# Patient Record
Sex: Male | Born: 1980 | Race: Black or African American | Hispanic: No | Marital: Single | Smoking: Current every day smoker
Health system: Southern US, Community
[De-identification: ages and names within clinical notes are randomized; demographics above are authoritative.]

---

## 2002-03-30 ENCOUNTER — Emergency Department (HOSPITAL_COMMUNITY): Admission: EM | Admit: 2002-03-30 | Discharge: 2002-03-30 | Payer: Self-pay | Admitting: Emergency Medicine

## 2005-09-19 ENCOUNTER — Emergency Department (HOSPITAL_COMMUNITY): Admission: EM | Admit: 2005-09-19 | Discharge: 2005-09-19 | Payer: Self-pay | Admitting: Emergency Medicine

## 2008-04-02 ENCOUNTER — Emergency Department (HOSPITAL_COMMUNITY): Admission: EM | Admit: 2008-04-02 | Discharge: 2008-04-02 | Payer: Self-pay | Admitting: Emergency Medicine

## 2008-04-16 ENCOUNTER — Ambulatory Visit (HOSPITAL_BASED_OUTPATIENT_CLINIC_OR_DEPARTMENT_OTHER): Admission: RE | Admit: 2008-04-16 | Discharge: 2008-04-16 | Payer: Self-pay | Admitting: Plastic Surgery

## 2009-01-03 HISTORY — PX: OTHER SURGICAL HISTORY: SHX169

## 2010-05-17 ENCOUNTER — Emergency Department (HOSPITAL_COMMUNITY)
Admission: EM | Admit: 2010-05-17 | Discharge: 2010-05-17 | Disposition: A | Discharge status: Home / Self Care | Payer: Federal, State, Local not specified - PPO | Attending: Emergency Medicine | Admitting: Emergency Medicine

## 2010-05-17 ENCOUNTER — Emergency Department (HOSPITAL_COMMUNITY): Payer: Federal, State, Local not specified - PPO

## 2010-05-17 DIAGNOSIS — W503XXA Accidental bite by another person, initial encounter: Secondary | ICD-10-CM | POA: Insufficient documentation

## 2010-05-17 DIAGNOSIS — Y9361 Activity, american tackle football: Secondary | ICD-10-CM | POA: Insufficient documentation

## 2010-05-17 DIAGNOSIS — Y9239 Other specified sports and athletic area as the place of occurrence of the external cause: Secondary | ICD-10-CM | POA: Insufficient documentation

## 2010-05-17 DIAGNOSIS — F17200 Nicotine dependence, unspecified, uncomplicated: Secondary | ICD-10-CM | POA: Insufficient documentation

## 2010-05-17 DIAGNOSIS — S61409A Unspecified open wound of unspecified hand, initial encounter: Secondary | ICD-10-CM | POA: Insufficient documentation

## 2010-05-18 NOTE — Op Note (Signed)
[name], [name]           ACCOUNT NO.:  [id number]   MEDICAL RECORD NO.:  [id number]          PATIENT TYPE:  AMB   LOCATION:  DSC                          FACILITY:  [facility]   PHYSICIAN:  Loreta Ave, MD DATE OF BIRTH:  May 25, 1980   DATE OF PROCEDURE:  04/16/2008  DATE OF DISCHARGE:                               OPERATIVE REPORT   PREOPERATIVE DIAGNOSIS:  Right skier's thumb.   POSTOPERATIVE DIAGNOSIS:  Right skier's thumb.   SURGEON:  Loreta Ave, MD   ANESTHESIA:  General with LMA.   TOURNIQUET TIME:  36 minutes at 250 mmHg.   COMPLICATIONS:  None.   ESTIMATED BLOOD LOSS:  None.   DRAINS:  None.   CLINICAL INDICATIONS:  Ian Ramsey is a 30 year old right hand  dominant male who sustained a right thumb injury approximately 2 weeks  previously while jumping over a fence.  He put his hands down to grab  the top of the fence as he jumped over and felt the ulnar side of his  left thumb MCP joint give away with pain and deformity.  On clinical  examination, his MCP joint has no end point to stress provided in the  radial direction and opens up at least 60 degrees both fully extended  and well flexed at 30 degrees.   After discussion of the risks of surgery which include but are not  limited to bleeding, infection, damage to nearby structures, failure of  the repair, stiffness, scarring, and the need for future surgery,  Ian Ramsey understands these risks and desires to proceed.   DESCRIPTION OF OPERATION:  The patient is brought to the operating room  and placed in the supine position on the operating room table.  After  smooth and routine induction of general anesthesia, a well-padded  pneumatic tourniquet was placed in the patient's right arm.  His right  upper extremity was prepped with ChloraPrep and 3 minutes were waited  before draping the patient.  A sterile field was created around the  right upper extremity.  The ulnar border of the right  thumb MCP joint  was approached with the lazy S incision centered around the ulnar side  of the thumb MCP joint.  The skin was incised with a 15 blade, and  superficial veins were coagulated with bipolar electrocautery.  Care was  taken not to damage any branches of the radial nerve.  The abductor  aponeurosis was then split longitudinally for 2.5 cm and the ulnar side  of the MCP joint was entered with a 15 blade.  On approaching the joint,  the ulnar collateral ligament was obviously torn and frayed and then  back by the abductor aponeurosis creating a Stener lesion.  It was found  that the tear was intrasubstance and there was distal ligament to sew  the repair to.  Two 3-0 Ethibond sutures were placed in figure-of-eight  fashion to reattach the ligament across the MCP joint.  The joint was  then gently stressed in radial direction and found to have approximately  25 degrees of give before reaching an obvious strong endpoint.  Next,  the abductor  aponeurosis was repaired with 4-0 Vicryl placed in  interrupted fashion.  The skin was repaired with interrupted buried 4-0  Monocryl sutures and a running 4-0 subcuticular Monocryl stitch.  Steri-Strips and Xeroform were applied to the incision as was a IP -  free thumb spica cast.  The tourniquet was deflated, and the patient was  extubated without incident.  He was transferred to the recovery room in  stable condition.  Sponge and needle counts were reported as correct.      Loreta Ave, MD  Electronically Signed     CF/MEDQ  D:  04/16/2008  T:  04/17/2008  Job:  [id number]

## 2010-05-20 ENCOUNTER — Emergency Department (HOSPITAL_COMMUNITY)
Admission: EM | Admit: 2010-05-20 | Discharge: 2010-05-21 | Discharge status: Against Medical Advice | Payer: Federal, State, Local not specified - PPO | Attending: Emergency Medicine | Admitting: Emergency Medicine

## 2010-05-20 DIAGNOSIS — T148XXA Other injury of unspecified body region, initial encounter: Secondary | ICD-10-CM | POA: Insufficient documentation

## 2010-05-20 DIAGNOSIS — M7989 Other specified soft tissue disorders: Secondary | ICD-10-CM | POA: Insufficient documentation

## 2010-05-20 DIAGNOSIS — W503XXA Accidental bite by another person, initial encounter: Secondary | ICD-10-CM | POA: Insufficient documentation

## 2010-05-20 DIAGNOSIS — L089 Local infection of the skin and subcutaneous tissue, unspecified: Secondary | ICD-10-CM | POA: Insufficient documentation

## 2010-05-21 ENCOUNTER — Emergency Department (HOSPITAL_COMMUNITY): Payer: Federal, State, Local not specified - PPO

## 2010-05-21 LAB — DIFFERENTIAL
Basophils Relative: 0 % (ref 0–1)
Eosinophils Relative: 2 % (ref 0–5)
Monocytes Absolute: 0.8 10*3/uL (ref 0.1–1.0)
Monocytes Relative: 9 % (ref 3–12)
Neutrophils Relative %: 62 % (ref 43–77)

## 2010-05-21 LAB — BASIC METABOLIC PANEL
BUN: 6 mg/dL (ref 6–23)
CO2: 28 mEq/L (ref 19–32)
Chloride: 101 mEq/L (ref 96–112)
Creatinine, Ser: 1.08 mg/dL (ref 0.4–1.5)
Glucose, Bld: 79 mg/dL (ref 70–99)
Potassium: 3.8 mEq/L (ref 3.5–5.1)

## 2010-05-21 LAB — CBC
HCT: 42.2 % (ref 39.0–52.0)
MCH: 22.8 pg — ABNORMAL LOW (ref 26.0–34.0)
MCV: 70.1 fL — ABNORMAL LOW (ref 78.0–100.0)
RBC: 6.02 MIL/uL — ABNORMAL HIGH (ref 4.22–5.81)
WBC: 8.8 10*3/uL (ref 4.0–10.5)
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
## 2011-07-21 ENCOUNTER — Emergency Department (HOSPITAL_COMMUNITY): Payer: Self-pay

## 2011-07-21 ENCOUNTER — Emergency Department (HOSPITAL_COMMUNITY)
Admission: EM | Admit: 2011-07-21 | Discharge: 2011-07-21 | Disposition: A | Discharge status: Home / Self Care | Payer: Self-pay | Attending: Emergency Medicine | Admitting: Emergency Medicine

## 2011-07-21 ENCOUNTER — Encounter (HOSPITAL_COMMUNITY): Payer: Self-pay | Admitting: *Deleted

## 2011-07-21 DIAGNOSIS — F411 Generalized anxiety disorder: Secondary | ICD-10-CM | POA: Insufficient documentation

## 2011-07-21 DIAGNOSIS — F419 Anxiety disorder, unspecified: Secondary | ICD-10-CM

## 2011-07-21 DIAGNOSIS — R0602 Shortness of breath: Secondary | ICD-10-CM | POA: Insufficient documentation

## 2011-07-21 NOTE — ED Provider Notes (Addendum)
History     CSN: [id number]  Arrival date & time 07/21/11  2128   First MD Initiated Contact with Patient 07/21/11 2139      Chief Complaint  Patient presents with  . Shortness of Breath    (Consider location/radiation/quality/duration/timing/severity/associated sxs/prior treatment) HPI Comments: Patient presents complaining of intermittent fast breathing over the last one to 2 weeks.  He notes episodes that last approximately 15-20 minutes that are associated with some tingling in his hand.  He notes they can come on suddenly and he has a feeling of doom and that he might die.  Once she is able to calm himself down his breathing returns to normal and he no longer has any respiratory symptoms.  He denies any chest pain, cough, fevers or hemoptysis.  He denies past history of anxiety or depression but does note increased life stressors since this began.  Patient did also note increase in his symptoms with smoking cigarettes or marijuana and so has significantly decreased his use of both of these substances.  Patient is a 31 y.o. male presenting with shortness of breath. The history is provided by the patient.  Shortness of Breath  Associated symptoms include shortness of breath. Pertinent negatives include no chest pain, no fever and no cough.    History reviewed. No pertinent past medical history.  History reviewed. No pertinent past surgical history.  History reviewed. No pertinent family history.  History  Substance Use Topics  . Smoking status: Current Everyday Smoker -- 0.5 packs/day  . Smokeless tobacco: Not on file  . Alcohol Use: 1.2 oz/week    2 Cans of beer per week      Review of Systems  Constitutional: Negative.  Negative for fever and chills.  HENT: Negative.   Eyes: Negative.  Negative for discharge and redness.  Respiratory: Positive for shortness of breath. Negative for cough.   Cardiovascular: Negative.  Negative for chest pain.  Gastrointestinal:  Negative.  Negative for nausea, vomiting and abdominal pain.  Genitourinary: Negative.   Musculoskeletal: Negative.  Negative for back pain.  Skin: Negative.  Negative for color change and rash.  Neurological: Negative for syncope and headaches.  Hematological: Negative.  Negative for adenopathy.  Psychiatric/Behavioral: Negative for confusion. The patient is nervous/anxious.   All other systems reviewed and are negative.    Allergies  Review of patient's allergies indicates no known allergies.  Home Medications  No current outpatient prescriptions on file.  BP 123/77  Pulse 84  Temp 98.9 F (37.2 C) (Oral)  Resp 22  SpO2 100%  Physical Exam  Nursing note and vitals reviewed. Constitutional: He is oriented to person, place, and time. He appears well-developed and well-nourished.  Non-toxic appearance. He does not have a sickly appearance.  HENT:  Head: Normocephalic and atraumatic.  Eyes: Conjunctivae, EOM and lids are normal. Pupils are equal, round, and reactive to light.  Neck: Trachea normal, normal range of motion and full passive range of motion without pain. Neck supple.  Cardiovascular: Normal rate, regular rhythm and normal heart sounds.   Pulmonary/Chest: Effort normal and breath sounds normal. No respiratory distress. He has no wheezes. He has no rales.  Abdominal: Soft. Normal appearance. He exhibits no distension. There is no tenderness. There is no rebound and no CVA tenderness.  Musculoskeletal: Normal range of motion. He exhibits no edema.  Neurological: He is alert and oriented to person, place, and time. He has normal strength.  Skin: Skin is warm, dry and intact. No rash  noted.  Psychiatric: He has a normal mood and affect. His behavior is normal. Judgment and thought content normal.    ED Course  Procedures (including critical care time)    07/21/2011  *RADIOLOGY REPORT*  Clinical Data: Shortness of breath  CHEST - 2 VIEW  Comparison: None.  Findings:  Cardiomediastinal silhouette is within normal limits. The lungs are clear. No pleural effusion.  No pneumothorax.  No acute osseous abnormality.  IMPRESSION: Normal chest.  Original Report Authenticated By: Harrel Lemon, M.D.       Date: 07/21/2011  Rate: 86  Rhythm: normal sinus rhythm  QRS Axis: normal  Intervals: normal  ST/T Wave abnormalities: normal  Conduction Disutrbances:none  Narrative Interpretation:   Old EKG Reviewed: none available   MDM  Patient with likely panic attacks based on his description.  He notes the onset of fast breathing and a feeling of doom and that he might die.  The symptoms last for 15-20 minutes and then he is able to calm himself down.  Once he does he feels much better and his breathing returns to normal.  He does note some increased life stressors since this began which could be the cause.  Patient does not have a primary care physician but does have medical insurance at this time and I anticipate that if his chest x-ray is normal that he can be safely discharged with followup with her primary care physician as he may need some medications to assist with these attacks.  Notably on my evaluation patient's respiratory rate is approximately 15 and he is breathing comfortably and notes that he feels back to normal.        Nat Christen, MD 07/21/11 2200  Nat Christen, MD 07/21/11 2238

## 2011-07-21 NOTE — ED Notes (Signed)
Pt c/o trouble breathing; breathing fast x 1 wk.  Left hand feeling tingling and going numb. Denies fever.  Feels like needs to burp.  ekg done in triage; pt states feels a lot of anxiety.  No respiratory distress noted in triage

## 2012-07-25 ENCOUNTER — Ambulatory Visit: Payer: Self-pay | Attending: Family Medicine | Admitting: Internal Medicine

## 2012-07-25 ENCOUNTER — Encounter: Payer: Self-pay | Admitting: Internal Medicine

## 2012-07-25 DIAGNOSIS — N390 Urinary tract infection, site not specified: Secondary | ICD-10-CM | POA: Insufficient documentation

## 2012-07-25 DIAGNOSIS — M62838 Other muscle spasm: Secondary | ICD-10-CM

## 2012-07-25 LAB — POCT URINALYSIS DIPSTICK
Blood, UA: NEGATIVE
Spec Grav, UA: 1.025
pH, UA: 6

## 2012-07-25 MED ORDER — CIPROFLOXACIN HCL 500 MG PO TABS: 500.0000 mg | ORAL_TABLET | Freq: Two times a day (BID) | ORAL | Status: DC

## 2012-07-25 NOTE — Progress Notes (Signed)
Patient ID: Ian Ramsey, male   DOB: [birth year], 32 y.o.   MRN: [id number]   HPI: 32 y/o patient with right upper back pain radiating to right shoulder. Having related cramping "spasms" in right chest wall. Occurring for 2 wks- applying muscle cream on it. Now tired of it and wanting to get further treatment.  Also feels like he has a UTI. Has urgency but no burning or discharge. Feels like he may pass on his UTi to his girlfriend. WAs checked for STD at health dept - was negative.   No Known Allergies History reviewed. No pertinent past medical history. Prior to Admission medications   Medication Sig Start Date End Date Taking? Authorizing Provider  ciprofloxacin (CIPRO) 500 MG tablet Take 1 tablet (500 mg total) by mouth 2 (two) times daily. 07/25/12   Calvert Cantor, MD   History reviewed. No pertinent family history. History   Social History  . Marital Status: Single    Spouse Name: N/A    Number of Children: N/A  . Years of Education: N/A   Occupational History  . Not on file.   Social History Main Topics  . Smoking status: Current Every Day Smoker -- 0.50 packs/day  . Smokeless tobacco: Not on file  . Alcohol Use: 1.2 oz/week    2 Cans of beer per week  . Drug Use: 7.00 per week    Special: Marijuana  . Sexually Active:    Other Topics Concern  . Not on file   Social History Narrative  . No narrative on file    Review of Systems ______ Constitutional: Negative for fever, chills, diaphoresis, activity change, appetite change and fatigue. ____ HENT: Negative for ear pain, nosebleeds, congestion, facial swelling, rhinorrhea, neck pain, neck stiffness and ear discharge.  ____ Eyes: Negative for pain, discharge, redness, itching and visual disturbance. ____ Respiratory: Negative for cough, choking, chest tightness, shortness of breath, wheezing and stridor.  ____ Cardiovascular: Negative for chest pain, palpitations and leg swelling. ____ Gastrointestinal: Negative  for Nausea/ Vomiting/ Diarrhea or Consitpation Genitourinary: Negative for dysuria, urgency, frequency, hematuria, flank pain, decreased urine volume, difficulty urinating and dyspareunia. ____ Musculoskeletal: Negative for back pain, joint swelling, arthralgias and gait problem. ________ Neurological: Negative for dizziness, tremors, seizures, syncope, facial asymmetry, speech difficulty, weakness, light-headedness, numbness and headaches. ____ Hematological: Negative for adenopathy. Does not bruise/bleed easily. ____ Psychiatric/Behavioral: Negative for hallucinations, behavioral problems, confusion, dysphoric mood, decreased concentration and agitation. ______   Objective:  There were no vitals filed for this visit.  Physical Exam ______ Constitutional: Appears well-developed and well-nourished. No distress. ____ HENT: Normocephalic. External right and left ear normal. Oropharynx is clear and moist. ____ Eyes: Conjunctivae and EOM are normal. PERRLA, no scleral icterus. ____ Neck: Normal ROM. Neck supple. No JVD. No tracheal deviation. No thyromegaly. ____ CVS: RRR, S1/S2 +, no murmurs, no gallops, no carotid bruit.  Pulmonary: Effort and breath sounds normal, no stridor, rhonchi, wheezes, rales.  Abdominal: Soft. BS +,  no distension, tenderness, rebound or guarding. ________ Musculoskeletal: Normal range of motion. Tender in right trapezius, over scapula,in biceps.  Lmphadenopathy: No lymphadenopathy noted, cervical, inguinal. Neuro: Alert. Normal reflexes, muscle tone coordination. No cranial nerve deficit. Skin: Skin is warm and dry. No rash noted. Not diaphoretic. No erythema. No pallor. ____ Psychiatric: Normal mood and affect. Behavior, judgment, thought content normal. __  Lab Results  Component Value Date   WBC 8.8 WHITE COUNT CONFIRMED ON SMEAR 05/21/2010   HGB 13.7 05/21/2010   HCT  42.2 05/21/2010   MCV 70.1* 05/21/2010   PLT 143 PLATELET COUNT CONFIRMED BY SMEAR* 05/21/2010    Lab Results  Component Value Date   CREATININE 1.08 05/21/2010   BUN 6 05/21/2010   NA 137 05/21/2010   K 3.8 05/21/2010   CL 101 05/21/2010   CO2 28 05/21/2010    No results found for this basename: HGBA1C   Lipid Panel  No results found for this basename: chol,  trig,  hdl,  cholhdl,  vldl,  ldlcalc       Assessment and plan:   Patient Active Problem List   Diagnosis Date Noted  . UTI (urinary tract infection) 07/25/2012  . Muscle spasm 07/25/2012    Cipro- 500 mg BID x 3 days  Muscle spasm/ sprain- Naprosyn and Aspercreme

## 2012-07-25 NOTE — Progress Notes (Signed)
ESTABLISH CARE R CHEST INTERMIT SPASMS WITH RADIATING NECK PAIN PENILE CLEAR D/C WITH NEG STD TEST X 2 WEEKS AGO @ HEALTH DEPARTMENT UTI SX'S

## 2012-07-25 NOTE — Patient Instructions (Signed)
ALLEVE- OVER THE COUNTER- TAKE AS PRECRIBED ON BOTTLE FOR AT LEAST 1 WK MUSCLE RUB- ASPERCREME 1% AVOID ACTIVITIES THAT MAKE PAIN WORSE   RICE: Routine Care for Injuries The routine care of many injuries includes Rest, Ice, Compression, and Elevation (RICE). HOME CARE INSTRUCTIONS  Rest is needed to allow your body to heal. Routine activities can usually be resumed when comfortable. Injured tendons and bones can take up to 6 weeks to heal. Tendons are the cord-like structures that attach muscle to bone.  Ice following an injury helps keep the swelling down and reduces pain.  Put ice in a plastic bag.  Place a towel between your skin and the bag.  Leave the ice on for 15-20 minutes, 3-4 times a day. Do this while awake, for the first 24 to 48 hours. After that, continue as directed by your caregiver.  Compression helps keep swelling down. It also gives support and helps with discomfort. If an elastic bandage has been applied, it should be removed and reapplied every 3 to 4 hours. It should not be applied tightly, but firmly enough to keep swelling down. Watch fingers or toes for swelling, bluish discoloration, coldness, numbness, or excessive pain. If any of these problems occur, remove the bandage and reapply loosely. Contact your caregiver if these problems continue.  Elevation helps reduce swelling and decreases pain. With extremities, such as the arms, hands, legs, and feet, the injured area should be placed near or above the level of the heart, if possible. SEEK IMMEDIATE MEDICAL CARE IF:  You have persistent pain and swelling.  You develop redness, numbness, or unexpected weakness.  Your symptoms are getting worse rather than improving after several days. These symptoms may indicate that further evaluation or further X-rays are needed. Sometimes, X-rays may not show a small broken bone (fracture) until 1 week or 10 days later. Make a follow-up appointment with your caregiver. Ask  when your X-ray results will be ready. Make sure you get your X-ray results. Document Released: 04/03/2000 Document Revised: 03/14/2011 Document Reviewed: 05/21/2010 [facility] Patient Information 2014 [place], [place].

## 2012-07-25 NOTE — Addendum Note (Signed)
Addended by: Calvert Cantor on: 07/25/2012 02:42 PM   Modules accepted: Orders

## 2012-08-01 ENCOUNTER — Ambulatory Visit: Payer: Self-pay

## 2012-08-08 ENCOUNTER — Ambulatory Visit: Payer: Self-pay

## 2012-08-24 ENCOUNTER — Ambulatory Visit: Payer: Self-pay

## 2013-10-29 ENCOUNTER — Ambulatory Visit: Payer: Self-pay | Admitting: Family Medicine

## 2014-02-17 IMAGING — CR DG CHEST 2V
2 series · 2 of 2 positions shown · non-contrast
Comparison: None.

CLINICAL DATA: Shortness of breath

CHEST - 2 VIEW

[w chest pa]
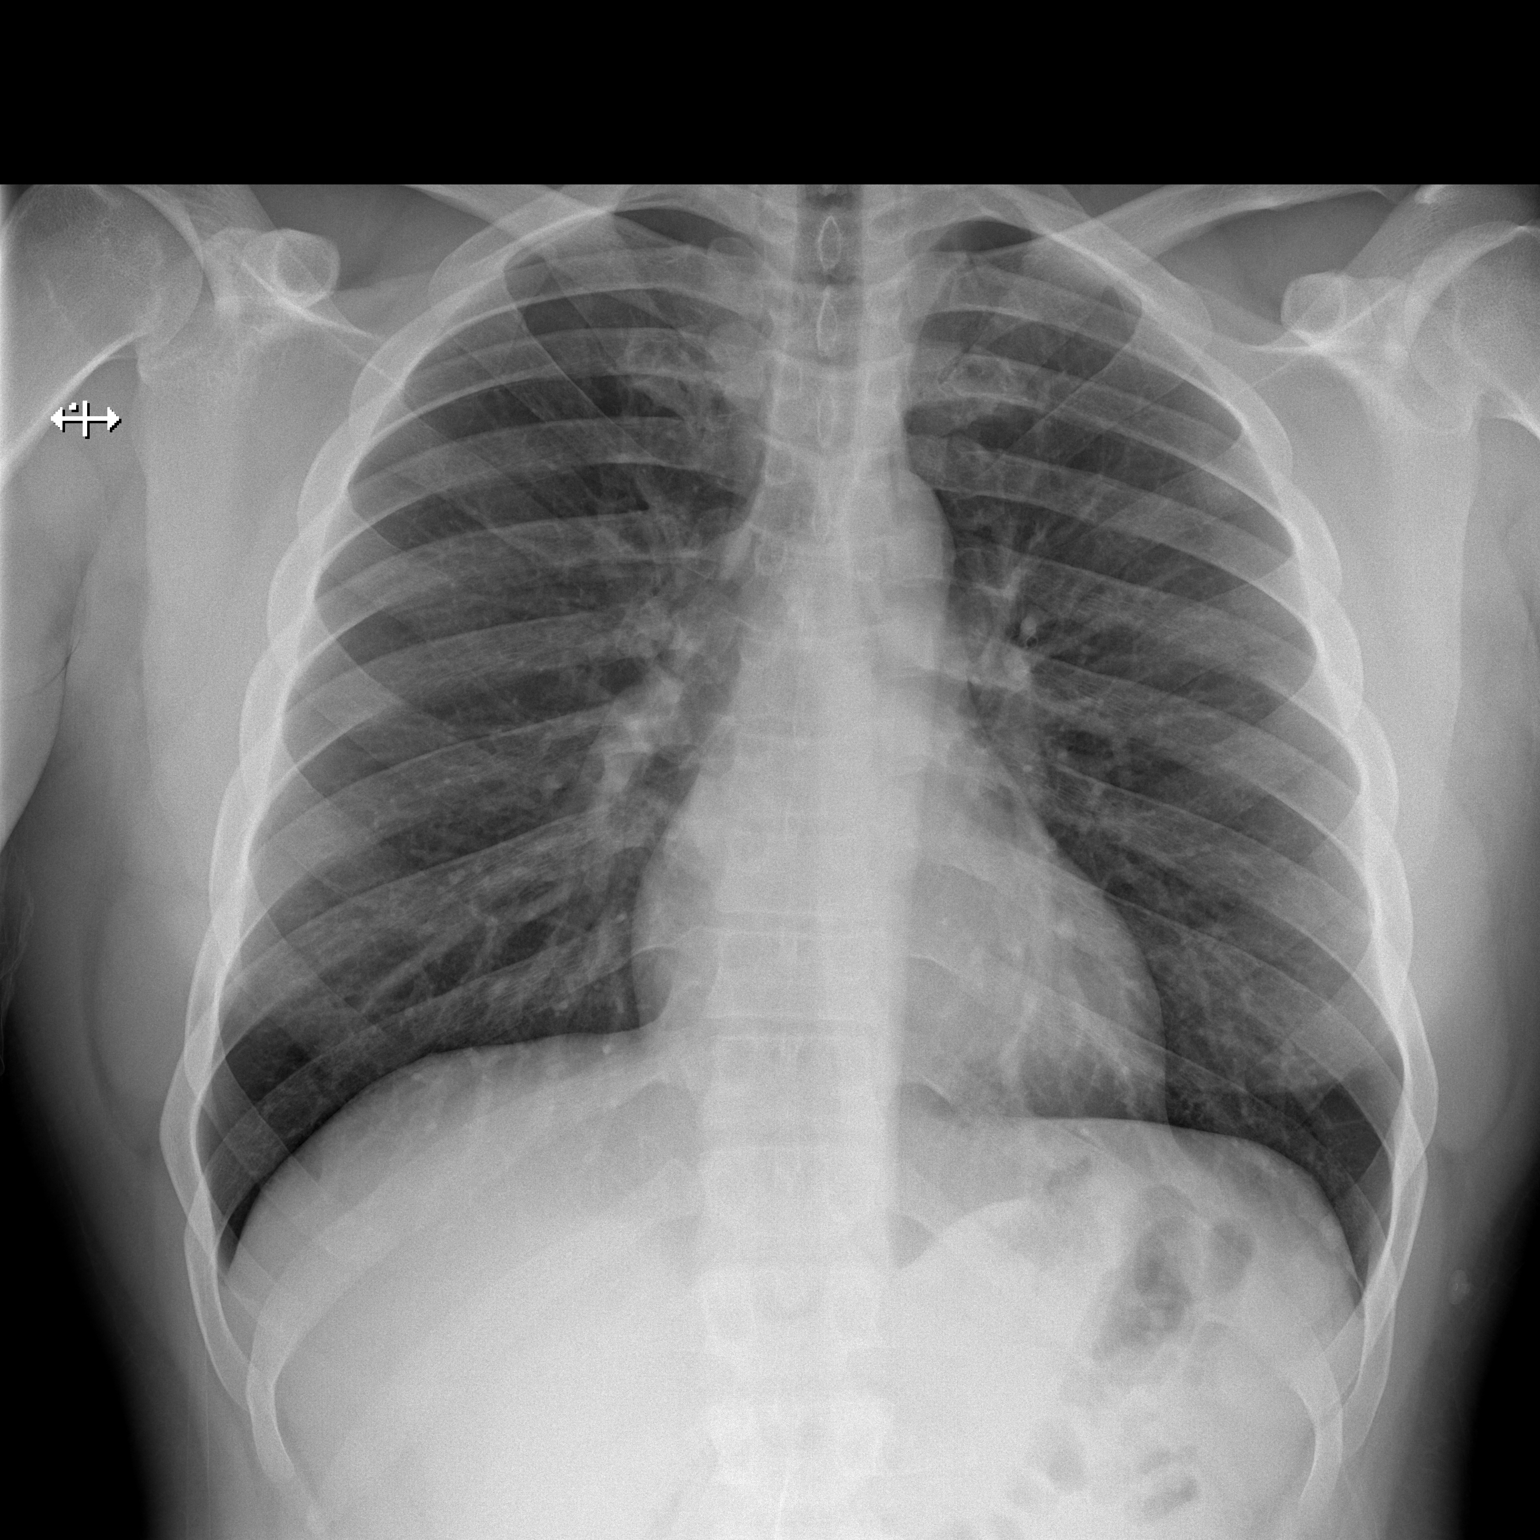

[w chest lat]
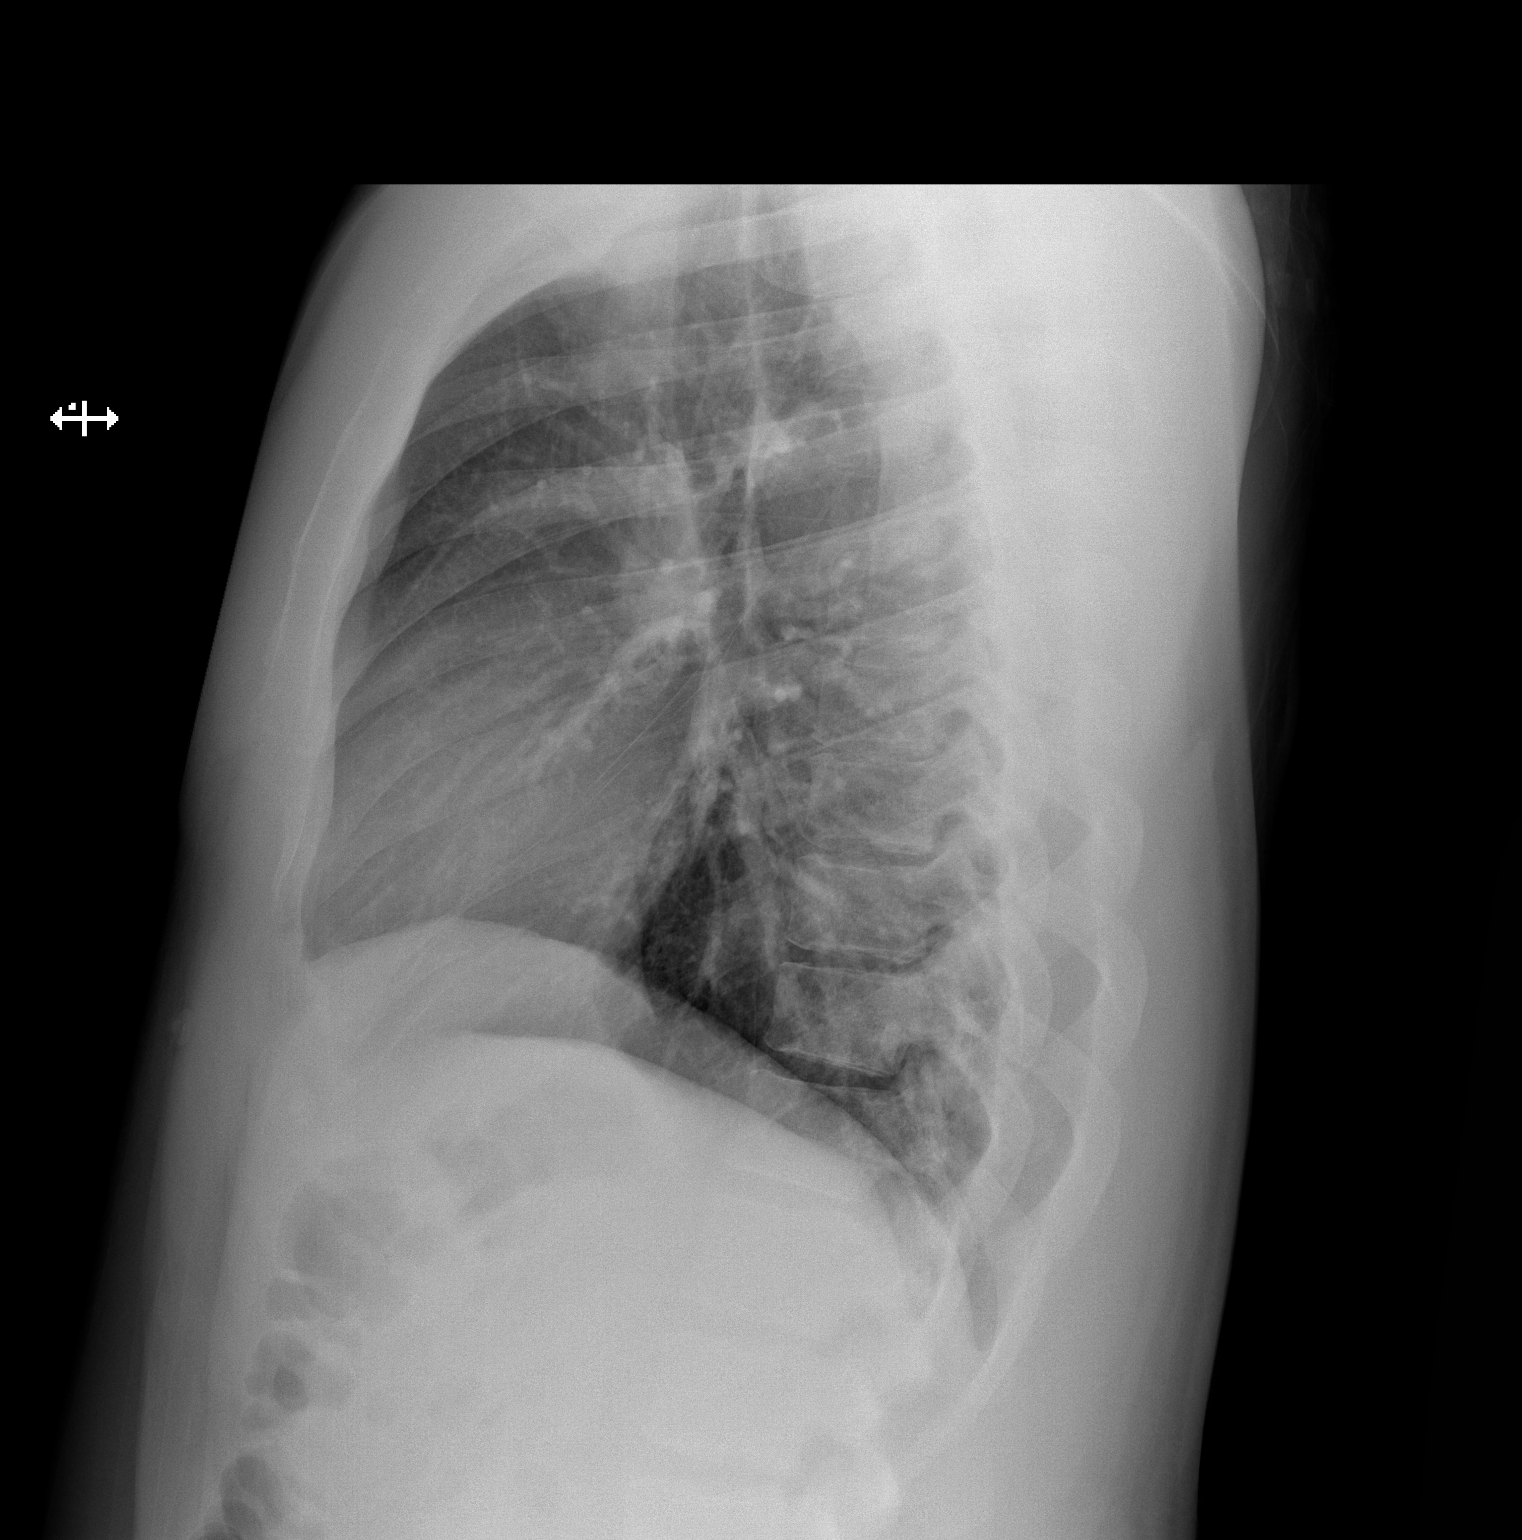

[2 of 2 positions shown; findings below may reference images not displayed]

FINDINGS: Cardiomediastinal silhouette is within normal limits. The
lungs are clear. No pleural effusion.  No pneumothorax.  No acute
osseous abnormality.
IMPRESSION: Normal chest.

## 2021-03-10 ENCOUNTER — Telehealth: Payer: Self-pay | Admitting: Physician Assistant

## 2021-03-10 ENCOUNTER — Telehealth: Payer: Self-pay | Admitting: Family Medicine

## 2021-03-10 DIAGNOSIS — H1013 Acute atopic conjunctivitis, bilateral: Secondary | ICD-10-CM

## 2021-03-10 DIAGNOSIS — J029 Acute pharyngitis, unspecified: Secondary | ICD-10-CM

## 2021-03-10 MED ORDER — PREDNISONE 20 MG PO TABS: 40.0000 mg | ORAL_TABLET | Freq: Every day | ORAL | 0 refills | Status: AC

## 2021-03-10 MED ORDER — FLUTICASONE PROPIONATE 50 MCG/ACT NA SUSP: 2.0000 | Freq: Every day | NASAL | 0 refills | Status: AC

## 2021-03-10 NOTE — Patient Instructions (Signed)
?  Ian Ramsey, thank you for joining Piedad Climes, PA-C for today's virtual visit.  While this provider is not your primary care provider (PCP), if your PCP is located in our provider database this encounter information will be shared with them immediately following your visit. ? ?Consent: ?(Patient) [name] provided verbal consent for this virtual visit at the beginning of the encounter. ? ?Current Medications: ? ?Current Outpatient Medications:  ?  ciprofloxacin (CIPRO) 500 MG tablet, Take 1 tablet (500 mg total) by mouth 2 (two) times daily., Disp: 6 tablet, Rfl: 0  ? ?Medications ordered in this encounter:  ?No orders of the defined types were placed in this encounter. ?  ? ?*If you need refills on other medications prior to your next appointment, please contact your pharmacy* ? ?Follow-Up: ?Call back or seek an in-person evaluation if the symptoms worsen or if the condition fails to improve as anticipated. ? ?Other Instructions ?Keep well-hydrated and get plenty of rest.  ?Start a saline nasal rinse daily. ?Use the Flonase once daily as directed. ?Start OTC Xyzal once per day. ?You can continue your Mucinex Sinus when needed. ?I recommend OTC Systane or [place] eye drops for allergic inflammation. ?If not resolving or noting anything new or worsening, let us know ASAP or be evaluated in person with your PCP or at local Urgent Care.  ? ? ?If you have been instructed to have an in-person evaluation today at a local Urgent Care facility, please use the link below. It will take you to a list of all of our available [facility], including address, phone number and hours of operation. Please do not delay care.  ?[facility] ? ?If you or a family member do not have a primary care provider, use the link below to schedule a visit and establish care. When you choose a [facility] primary care physician or advanced practice provider, you gain a long-term partner in  health. ?Find a Primary Care Provider ? ?Learn more about [facility]'s in-office and virtual care options: ?[facility] - Get Care Now  ?

## 2021-03-10 NOTE — Telephone Encounter (Signed)
Copied from CRM [id number]. Topic: Appointment Scheduling - Scheduling Inquiry for Clinic ?>> Mar 10, 2021  1:31 PM Elliot Gault wrote: ?Patient has no insurance and would like to apply for [facility] financial assistance ?

## 2021-03-10 NOTE — Progress Notes (Signed)
?Virtual Visit Consent  ? ?Ian Ramsey, you are scheduled for a virtual visit with a [facility] provider today.   ?  ?Just as with appointments in the office, your consent must be obtained to participate.  Your consent will be active for this visit and any virtual visit you may have with one of our providers in the next 365 days.   ?  ?If you have a MyChart account, a copy of this consent can be sent to you electronically.  All virtual visits are billed to your insurance company just like a traditional visit in the office.   ? ?As this is a virtual visit, video technology does not allow for your provider to perform a traditional examination.  This may limit your provider's ability to fully assess your condition.  If your provider identifies any concerns that need to be evaluated in person or the need to arrange testing (such as labs, EKG, etc.), we will make arrangements to do so.   ?  ?Although advances in technology are sophisticated, we cannot ensure that it will always work on either your end or our end.  If the connection with a video visit is poor, the visit may have to be switched to a telephone visit.  With either a video or telephone visit, we are not always able to ensure that we have a secure connection.    ? ?I need to obtain your verbal consent now.   Are you willing to proceed with your visit today?  ?  ?Ian Ramsey has provided verbal consent on 03/10/2021 for a virtual visit (video or telephone). ?  ?Leeanne Rio, PA-C  ? ?Date: 03/10/2021 1:52 PM ? ? ?Virtual Visit via Video Note  ? ?Ian Ramsey, connected with  Ian Ramsey  ([id number], [birth year]) on 03/10/21 at  1:45 PM EST by a video-enabled telemedicine application and verified that I am speaking with the correct person using two identifiers. ? ?Location: ?Patient: Virtual Visit Location Patient: Other: Work ?Provider: Virtual Visit Location Provider: Home Office ?  ?I discussed the limitations of evaluation and  management by telemedicine and the availability of in person appointments. The patient expressed understanding and agreed to proceed.   ? ?History of Present Illness: ?Ian Ramsey is a 41 y.o. who identifies as a male who was assigned male at birth, and is being seen today for symptoms starting a few days ago. Patient endorses starting with a sore throat initially left and now bilateral. Increased soreness with some tonsil swelling. Unsure of exudate. Notes some nasal congestion, rhinorrhea, sneezing and pnd along with that without any chest congestion or sinus pressure. Occasionally coughs to clear throat. Notes some bilateral eye redness and itching with watering. Notes he has never had this issue before. Denies fever, chest pain or SOB. Notes his daughter is being treated for a double ear infection. Has been taking Dayquil, OTC antihistamines and Mucinex severe sinus without much relief.  ? ?HPI: HPI  ?Problems:  ?Patient Active Problem List  ? Diagnosis Date Noted  ? UTI (urinary tract infection) 07/25/2012  ? Muscle spasm 07/25/2012  ?  ?Allergies: No Known Allergies ?Medications: No current outpatient medications on file. ? ?Observations/Objective: ?Patient is well-developed, well-nourished in no acute distress.  ?Walking throughout visit.Marland Kitchen  ?Head is normocephalic, atraumatic.  ?No labored breathing. ?Speech is clear and coherent with logical content.  ?Patient is alert and oriented at baseline.  ?Harder to visualize full posterior oropharynx due to size of tongue and  issues with camera handling. No substantial erythema noted. What I can see of tonsils bilaterally are larger but without exudate or significant erythema. Cannot visualize lower portion of tonsils. Uvula is midline. ?Conjunctiva with mild injection bilaterally without drainage. No notes eyelid swelling. EOMI. ? ?Assessment and Plan: ?1. Sore throat ? ?2. Acute allergic conjunctivitis, bilateral ? ?Seems to be allergic in nature giving  sneezing, rhinorrhea, PND, sore throat and itchy/watery eyes. Cannot rule out co-existing viral deal giving daughter's recent illness that culminated in bilateral ear infections. Supportive measures, OTC medications reviewed. Will have him start saline nasal rinse daily along with Flonase per orders. Continue Mucinex as needed. Start Daily Xyzal. Can use systane drops OTC. He is to let us know if not resolving or any new or worsening symptoms.  ? ?Follow Up Instructions: ?I discussed the assessment and treatment plan with the patient. The patient was provided an opportunity to ask questions and all were answered. The patient agreed with the plan and demonstrated an understanding of the instructions.  A copy of instructions were sent to the patient via MyChart unless otherwise noted below.  ? ?The patient was advised to call back or seek an in-person evaluation if the symptoms worsen or if the condition fails to improve as anticipated. ? ?Time:  ?I spent 10 minutes with the patient via telehealth technology discussing the above problems/concerns.   ? ?Leeanne Rio, PA-C ?

## 2021-03-10 NOTE — Addendum Note (Signed)
Addended by: Waldon Merl on: 03/10/2021 04:29 PM ? ? Modules accepted: Orders ? ?

## 2021-03-16 NOTE — Telephone Encounter (Signed)
I return Pt call, I explain him of the rules of the clinic for new Pt ?

## 2021-06-29 ENCOUNTER — Ambulatory Visit: Payer: Self-pay | Admitting: Family Medicine

## 2022-02-09 DIAGNOSIS — R7309 Other abnormal glucose: Secondary | ICD-10-CM | POA: Diagnosis not present

## 2022-02-09 DIAGNOSIS — E782 Mixed hyperlipidemia: Secondary | ICD-10-CM | POA: Diagnosis not present

## 2022-02-09 DIAGNOSIS — E118 Type 2 diabetes mellitus with unspecified complications: Secondary | ICD-10-CM | POA: Diagnosis not present

## 2022-02-09 DIAGNOSIS — D649 Anemia, unspecified: Secondary | ICD-10-CM | POA: Diagnosis not present

## 2022-02-09 DIAGNOSIS — I10 Essential (primary) hypertension: Secondary | ICD-10-CM | POA: Diagnosis not present

## 2022-02-09 DIAGNOSIS — Z0000 Encounter for general adult medical examination without abnormal findings: Secondary | ICD-10-CM | POA: Diagnosis not present

## 2022-07-11 DIAGNOSIS — Z23 Encounter for immunization: Secondary | ICD-10-CM | POA: Diagnosis not present

## 2022-07-11 DIAGNOSIS — W228XXA Striking against or struck by other objects, initial encounter: Secondary | ICD-10-CM | POA: Diagnosis not present

## 2022-07-11 DIAGNOSIS — S6991XA Unspecified injury of right wrist, hand and finger(s), initial encounter: Secondary | ICD-10-CM | POA: Diagnosis not present

## 2022-07-11 DIAGNOSIS — I10 Essential (primary) hypertension: Secondary | ICD-10-CM | POA: Diagnosis not present

## 2022-07-11 DIAGNOSIS — Z87891 Personal history of nicotine dependence: Secondary | ICD-10-CM | POA: Diagnosis not present

## 2022-07-11 DIAGNOSIS — S60921A Unspecified superficial injury of right hand, initial encounter: Secondary | ICD-10-CM | POA: Diagnosis not present

## 2022-07-11 DIAGNOSIS — S61411A Laceration without foreign body of right hand, initial encounter: Secondary | ICD-10-CM | POA: Diagnosis not present

## 2022-08-01 ENCOUNTER — Ambulatory Visit: Payer: Self-pay | Admitting: *Deleted

## 2022-08-01 ENCOUNTER — Telehealth: Payer: Self-pay | Admitting: Physician Assistant

## 2022-08-01 DIAGNOSIS — J019 Acute sinusitis, unspecified: Secondary | ICD-10-CM

## 2022-08-01 DIAGNOSIS — B9689 Other specified bacterial agents as the cause of diseases classified elsewhere: Secondary | ICD-10-CM

## 2022-08-01 MED ORDER — AMOXICILLIN-POT CLAVULANATE 875-125 MG PO TABS: 1.0000 | ORAL_TABLET | Freq: Two times a day (BID) | ORAL | 0 refills | Status: AC

## 2022-08-01 NOTE — Progress Notes (Signed)
Virtual Visit Consent   Ian Ramsey, you are scheduled for a virtual visit with a [facility] provider today. Just as with appointments in the office, your consent must be obtained to participate. Your consent will be active for this visit and any virtual visit you may have with one of our providers in the next 365 days. If you have a MyChart account, a copy of this consent can be sent to you electronically.  As this is a virtual visit, video technology does not allow for your provider to perform a traditional examination. This may limit your provider's ability to fully assess your condition. If your provider identifies any concerns that need to be evaluated in person or the need to arrange testing (such as labs, EKG, etc.), we will make arrangements to do so. Although advances in technology are sophisticated, we cannot ensure that it will always work on either your end or our end. If the connection with a video visit is poor, the visit may have to be switched to a telephone visit. With either a video or telephone visit, we are not always able to ensure that we have a secure connection.  By engaging in this virtual visit, you consent to the provision of healthcare and authorize for your insurance to be billed (if applicable) for the services provided during this visit. Depending on your insurance coverage, you may receive a charge related to this service.  I need to obtain your verbal consent now. Are you willing to proceed with your visit today? Ian Ramsey has provided verbal consent on 08/01/2022 for a virtual visit (video or telephone). Ian Loveless, PA-C  Date: 08/01/2022 2:41 PM  Virtual Visit via Video Note   IMargaretann Ramsey, connected with  Ian Ramsey  ([id number], 14-Mar-1980) on 08/01/22 at  2:30 PM EDT by a video-enabled telemedicine application and verified that I am speaking with the correct person using two identifiers.  Location: Patient: Virtual Visit  Location Patient: Home Provider: Virtual Visit Location Provider: Home Office   I discussed the limitations of evaluation and management by telemedicine and the availability of in person appointments. The patient expressed understanding and agreed to proceed.    History of Present Illness: Ian Ramsey is a 42 y.o. who identifies as a male who was assigned male at birth, and is being seen today for URI/sinus symptoms.  HPI: URI  This is a new problem. The current episode started 1 to 4 weeks ago. The problem has been gradually worsening. There has been no fever. Associated symptoms include congestion, coughing, ear pain, headaches, a plugged ear sensation, rhinorrhea and sinus pain. Pertinent negatives include no chest pain, diarrhea, nausea, sore throat, vomiting or wheezing. He has tried decongestant, antihistamine and sleep (saline nasal rinses) for the symptoms. The treatment provided no relief.     Problems:  Patient Active Problem List   Diagnosis Date Noted   UTI (urinary tract infection) 07/25/2012   Muscle spasm 07/25/2012    Allergies: No Known Allergies Medications:  Current Outpatient Medications:    amoxicillin-clavulanate (AUGMENTIN) 875-125 MG tablet, Take 1 tablet by mouth 2 (two) times daily., Disp: 20 tablet, Rfl: 0   fluticasone (FLONASE) 50 MCG/ACT nasal spray, Place 2 sprays into both nostrils daily., Disp: 16 g, Rfl: 0  Observations/Objective: Patient is well-developed, well-nourished in no acute distress.  Resting comfortably  at home.  Head is normocephalic, atraumatic.  No labored breathing.  Speech is clear and coherent with logical content.  Patient is  alert and oriented at baseline.    Assessment and Plan: 1. Acute bacterial sinusitis - amoxicillin-clavulanate (AUGMENTIN) 875-125 MG tablet; Take 1 tablet by mouth 2 (two) times daily.  Dispense: 20 tablet; Refill: 0  - Worsening symptoms that have not responded to OTC medications.  - Will give  Augmentin - Continue allergy medications.  - Steam and humidifier can help - Stay well hydrated and get plenty of rest.  - Seek in person evaluation if no symptom improvement or if symptoms worsen   Follow Up Instructions: I discussed the assessment and treatment plan with the patient. The patient was provided an opportunity to ask questions and all were answered. The patient agreed with the plan and demonstrated an understanding of the instructions.  A copy of instructions were sent to the patient via MyChart unless otherwise noted below.    The patient was advised to call back or seek an in-person evaluation if the symptoms worsen or if the condition fails to improve as anticipated.  Time:  I spent 10 minutes with the patient via telehealth technology discussing the above problems/concerns.    Ian Loveless, PA-C

## 2022-08-01 NOTE — Patient Instructions (Signed)
Leitha Schuller, thank you for joining Margaretann Loveless, PA-C for today's virtual visit.  While this provider is not your primary care provider (PCP), if your PCP is located in our provider database this encounter information will be shared with them immediately following your visit.   A [facility] MyChart account gives you access to today's visit and all your visits, tests, and labs performed at [facility] " click here if you don't have a [facility] MyChart account or go to [email or web address]  Consent: (Patient) Ian Ramsey provided verbal consent for this virtual visit at the beginning of the encounter.  Current Medications:  Current Outpatient Medications:    amoxicillin-clavulanate (AUGMENTIN) 875-125 MG tablet, Take 1 tablet by mouth 2 (two) times daily., Disp: 20 tablet, Rfl: 0   fluticasone (FLONASE) 50 MCG/ACT nasal spray, Place 2 sprays into both nostrils daily., Disp: 16 g, Rfl: 0   Medications ordered in this encounter:  Meds ordered this encounter  Medications   amoxicillin-clavulanate (AUGMENTIN) 875-125 MG tablet    Sig: Take 1 tablet by mouth 2 (two) times daily.    Dispense:  20 tablet    Refill:  0    Order Specific Question:   Supervising Provider    Answer:   Merrilee Jansky [id number]     *If you need refills on other medications prior to your next appointment, please contact your pharmacy*  Follow-Up: Call back or seek an in-person evaluation if the symptoms worsen or if the condition fails to improve as anticipated.  [facility] [phone]  Other Instructions Sinus Infection, Adult A sinus infection, also called sinusitis, is inflammation of your sinuses. Sinuses are hollow spaces in the bones around your face. Your sinuses are located: Around your eyes. In the middle of your forehead. Behind your nose. In your cheekbones. Mucus normally drains out of your sinuses. When your nasal tissues become inflamed  or swollen, mucus can become trapped or blocked. This allows bacteria, viruses, and fungi to grow, which leads to infection. Most infections of the sinuses are caused by a virus. A sinus infection can develop quickly. It can last for up to 4 weeks (acute) or for more than 12 weeks (chronic). A sinus infection often develops after a cold. What are the causes? This condition is caused by anything that creates swelling in the sinuses or stops mucus from draining. This includes: Allergies. Asthma. Infection from bacteria or viruses. Deformities or blockages in your nose or sinuses. Abnormal growths in the nose (nasal polyps). Pollutants, such as chemicals or irritants in the air. Infection from fungi. This is rare. What increases the risk? You are more likely to develop this condition if you: Have a weak body defense system (immune system). Do a lot of swimming or diving. Overuse nasal sprays. Smoke. What are the signs or symptoms? The main symptoms of this condition are pain and a feeling of pressure around the affected sinuses. Other symptoms include: Stuffy nose or congestion that makes it difficult to breathe through your nose. Thick yellow or greenish drainage from your nose. Tenderness, swelling, and warmth over the affected sinuses. A cough that may get worse at night. Decreased sense of smell and taste. Extra mucus that collects in the throat or the back of the nose (postnasal drip) causing a sore throat or bad breath. Tiredness (fatigue). Fever. How is this diagnosed? This condition is diagnosed based on: Your symptoms. Your medical history. A physical exam. Tests to find out if your  condition is acute or chronic. This may include: Checking your nose for nasal polyps. Viewing your sinuses using a device that has a light (endoscope). Testing for allergies or bacteria. Imaging tests, such as an MRI or CT scan. In rare cases, a bone biopsy may be done to rule out more serious  types of fungal sinus disease. How is this treated? Treatment for a sinus infection depends on the cause and whether your condition is chronic or acute. If caused by a virus, your symptoms should go away on their own within 10 days. You may be given medicines to relieve symptoms. They include: Medicines that shrink swollen nasal passages (decongestants). A spray that eases inflammation of the nostrils (topical intranasal corticosteroids). Rinses that help get rid of thick mucus in your nose (nasal saline washes). Medicines that treat allergies (antihistamines). Over-the-counter pain relievers. If caused by bacteria, your health care provider may recommend waiting to see if your symptoms improve. Most bacterial infections will get better without antibiotic medicine. You may be given antibiotics if you have: A severe infection. A weak immune system. If caused by narrow nasal passages or nasal polyps, surgery may be needed. Follow these instructions at home: Medicines Take, use, or apply over-the-counter and prescription medicines only as told by your health care provider. These may include nasal sprays. If you were prescribed an antibiotic medicine, take it as told by your health care provider. Do not stop taking the antibiotic even if you start to feel better. Hydrate and humidify  Drink enough fluid to keep your urine pale yellow. Staying hydrated will help to thin your mucus. Use a cool mist humidifier to keep the humidity level in your home above 50%. Inhale steam for 10-15 minutes, 3-4 times a day, or as told by your health care provider. You can do this in the bathroom while a hot shower is running. Limit your exposure to cool or dry air. Rest Rest as much as possible. Sleep with your head raised (elevated). Make sure you get enough sleep each night. General instructions  Apply a warm, moist washcloth to your face 3-4 times a day or as told by your health care provider. This will  help with discomfort. Use nasal saline washes as often as told by your health care provider. Wash your hands often with soap and water to reduce your exposure to germs. If soap and water are not available, use hand sanitizer. Do not smoke. Avoid being around people who are smoking (secondhand smoke). Keep all follow-up visits. This is important. Contact a health care provider if: You have a fever. Your symptoms get worse. Your symptoms do not improve within 10 days. Get help right away if: You have a severe headache. You have persistent vomiting. You have severe pain or swelling around your face or eyes. You have vision problems. You develop confusion. Your neck is stiff. You have trouble breathing. These symptoms may be an emergency. Get help right away. Call 911. Do not wait to see if the symptoms will go away. Do not drive yourself to the hospital. Summary A sinus infection is soreness and inflammation of your sinuses. Sinuses are hollow spaces in the bones around your face. This condition is caused by nasal tissues that become inflamed or swollen. The swelling traps or blocks the flow of mucus. This allows bacteria, viruses, and fungi to grow, which leads to infection. If you were prescribed an antibiotic medicine, take it as told by your health care provider. Do not stop  taking the antibiotic even if you start to feel better. Keep all follow-up visits. This is important. This information is not intended to replace advice given to you by your health care provider. Make sure you discuss any questions you have with your health care provider. Document Revised: 11/24/2020 Document Reviewed: 11/24/2020 Elsevier Patient Education  2024 [employer].    If you have been instructed to have an in-person evaluation today at a local Urgent Care facility, please use the link below. It will take you to a list of all of our available [facility], including address, phone number and  hours of operation. Please do not delay care.  [facility]  If you or a family member do not have a primary care provider, use the link below to schedule a visit and establish care. When you choose a [facility] primary care physician or advanced practice provider, you gain a long-term partner in health. Find a Primary Care Provider  Learn more about [facility] in-office and virtual care options:  - Get Care Now

## 2022-08-01 NOTE — Telephone Encounter (Signed)
Summary: head cold like symptoms   Patient called in regards to experiencing a stuffy nose, coughing, head cold like symptoms &n coughing up phlem, ears popping & pressure in forehead, no fever. Please advise.  Patients callback # [phone]       Chief Complaint: cold sx Symptoms: sinus pressure, nasal congestion yellow / green drainage, cough productive yellow green mucus. Ears popping. At home covid test negative last Tuesday  Frequency: last week  Pertinent Negatives: Patient denies chest pain no difficulty breathing no fever Disposition: [] ED /[] Urgent Care (no appt availability in office) / [] Appointment(In office/virtual)/ [x]  [facility]/ [] Home Care/ [] Refused Recommended Disposition /[] [facility]/ []  Follow-up with PCP Additional Notes:   Recommended UC . Patient requesting online UC VV due to has to go to work. Appt scheduled UC VV today at 2:30. End of call , call was disconnected due to systems issues.       Reason for Disposition  [facility]  Answer Assessment - Initial Assessment Questions 1. ONSET: "When did the cough begin?"      Last week  2. SEVERITY: "How bad is the cough today?"      Productive  3. SPUTUM: "Describe the color of your sputum" (none, dry cough; clear, white, yellow, green)     Yellow / green  4. HEMOPTYSIS: "Are you coughing up any blood?" If so ask: "How much?" (flecks, streaks, tablespoons, etc.)     No  5. DIFFICULTY BREATHING: "Are you having difficulty breathing?" If Yes, ask: "How bad is it?" (e.g., mild, moderate, severe)    - MILD: No SOB at rest, mild SOB with walking, speaks normally in sentences, can lie down, no retractions, pulse < 100.    - MODERATE: SOB at rest, SOB with minimal exertion and prefers to sit, cannot lie down flat, speaks in phrases, mild retractions, audible wheezing, pulse 100-120.    - SEVERE: Very SOB at rest, speaks in single words, struggling to breathe, sitting hunched forward,  retractions, pulse > 120      Denies  6. FEVER: "Do you have a fever?" If Yes, ask: "What is your temperature, how was it measured, and when did it start?"     No  7. CARDIAC HISTORY: "Do you have any history of heart disease?" (e.g., heart attack, congestive heart failure)      Na  8. LUNG HISTORY: "Do you have any history of lung disease?"  (e.g., pulmonary embolus, asthma, emphysema)     Na  9. PE RISK FACTORS: "Do you have a history of blood clots?" (or: recent major surgery, recent prolonged travel, bedridden)     Na  10. OTHER SYMPTOMS: "Do you have any other symptoms?" (e.g., runny nose, wheezing, chest pain)       Nasal congestion blowing nose for yellow green drainage/ mucus , sinus pressure. Ear popping.  11. PREGNANCY: "Is there any chance you are pregnant?" "When was your last menstrual period?"       na 12. TRAVEL: "Have you traveled out of the country in the last month?" (e.g., travel history, exposures)       na  Protocols used: Cough - Acute Productive-A-AH

## 2022-12-16 DIAGNOSIS — E118 Type 2 diabetes mellitus with unspecified complications: Secondary | ICD-10-CM | POA: Diagnosis not present

## 2022-12-16 DIAGNOSIS — I10 Essential (primary) hypertension: Secondary | ICD-10-CM | POA: Diagnosis not present

## 2022-12-16 DIAGNOSIS — E782 Mixed hyperlipidemia: Secondary | ICD-10-CM | POA: Diagnosis not present

## 2023-03-30 DIAGNOSIS — E118 Type 2 diabetes mellitus with unspecified complications: Secondary | ICD-10-CM | POA: Diagnosis not present

## 2023-03-30 DIAGNOSIS — Z0000 Encounter for general adult medical examination without abnormal findings: Secondary | ICD-10-CM | POA: Diagnosis not present

## 2023-03-30 DIAGNOSIS — E782 Mixed hyperlipidemia: Secondary | ICD-10-CM | POA: Diagnosis not present

## 2023-03-30 DIAGNOSIS — I10 Essential (primary) hypertension: Secondary | ICD-10-CM | POA: Diagnosis not present
